# Patient Record
Sex: Female | Born: 1965 | Race: Black or African American | Hispanic: No | Marital: Married | Smoking: Never smoker
Health system: Southern US, Community
[De-identification: ages and names within clinical notes are randomized; demographics above are authoritative.]

## PROBLEM LIST (undated history)

## (undated) DIAGNOSIS — N840 Polyp of corpus uteri: Secondary | ICD-10-CM

## (undated) DIAGNOSIS — K219 Gastro-esophageal reflux disease without esophagitis: Secondary | ICD-10-CM

## (undated) DIAGNOSIS — Z973 Presence of spectacles and contact lenses: Secondary | ICD-10-CM

## (undated) DIAGNOSIS — D250 Submucous leiomyoma of uterus: Secondary | ICD-10-CM

## (undated) HISTORY — PX: NO PAST SURGERIES: SHX2092

---

## 1998-09-24 ENCOUNTER — Ambulatory Visit (HOSPITAL_COMMUNITY): Admission: RE | Admit: 1998-09-24 | Discharge: 1998-09-24 | Payer: Self-pay | Admitting: Physician Assistant

## 1999-11-12 ENCOUNTER — Other Ambulatory Visit: Admission: RE | Admit: 1999-11-12 | Discharge: 1999-11-12 | Payer: Self-pay | Admitting: Obstetrics & Gynecology

## 2000-10-22 ENCOUNTER — Inpatient Hospital Stay (HOSPITAL_COMMUNITY): Admission: AD | Admit: 2000-10-22 | Discharge: 2000-10-22 | Payer: Self-pay | Admitting: Obstetrics and Gynecology

## 2001-02-25 ENCOUNTER — Encounter: Admission: RE | Admit: 2001-02-25 | Discharge: 2001-02-25 | Payer: Self-pay | Admitting: Family Medicine

## 2001-02-25 ENCOUNTER — Encounter: Payer: Self-pay | Admitting: Family Medicine

## 2001-06-24 ENCOUNTER — Other Ambulatory Visit: Admission: RE | Admit: 2001-06-24 | Discharge: 2001-06-24 | Payer: Self-pay | Admitting: Obstetrics and Gynecology

## 2002-08-24 ENCOUNTER — Other Ambulatory Visit: Admission: RE | Admit: 2002-08-24 | Discharge: 2002-08-24 | Payer: Self-pay | Admitting: Obstetrics and Gynecology

## 2004-01-06 ENCOUNTER — Emergency Department (HOSPITAL_COMMUNITY): Admission: EM | Admit: 2004-01-06 | Discharge: 2004-01-06 | Payer: Self-pay

## 2004-09-30 ENCOUNTER — Other Ambulatory Visit: Admission: RE | Admit: 2004-09-30 | Discharge: 2004-09-30 | Payer: Self-pay | Admitting: Obstetrics and Gynecology

## 2005-11-07 ENCOUNTER — Other Ambulatory Visit: Admission: RE | Admit: 2005-11-07 | Discharge: 2005-11-07 | Payer: Self-pay | Admitting: Obstetrics and Gynecology

## 2008-05-05 ENCOUNTER — Encounter: Admission: RE | Admit: 2008-05-05 | Discharge: 2008-05-05 | Payer: Self-pay | Admitting: Family Medicine

## 2009-11-25 IMAGING — US US ABDOMEN COMPLETE
1 series · 14 of 25 positions shown · non-contrast
Comparison: None

CLINICAL DATA: Abdominal pain radiating to the back, nausea and
vomiting

ABDOMEN ULTRASOUND
TECHNIQUE: Complete abdominal ultrasound examination was performed
including evaluation of the liver, gallbladder, bile ducts,
pancreas, kidneys, spleen, IVC, and abdominal aorta.

[Series 1: us abdomen complete · 0.30mm/px · 14 of 55 slices shown]
[im 1/55]
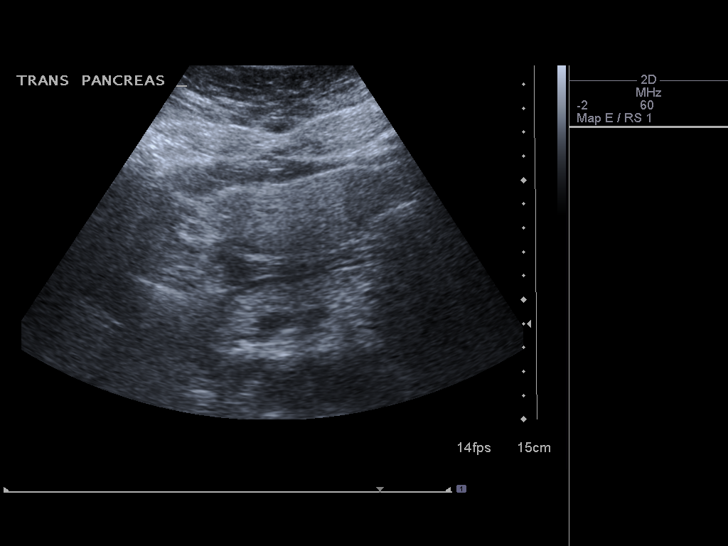
[im 5/55]
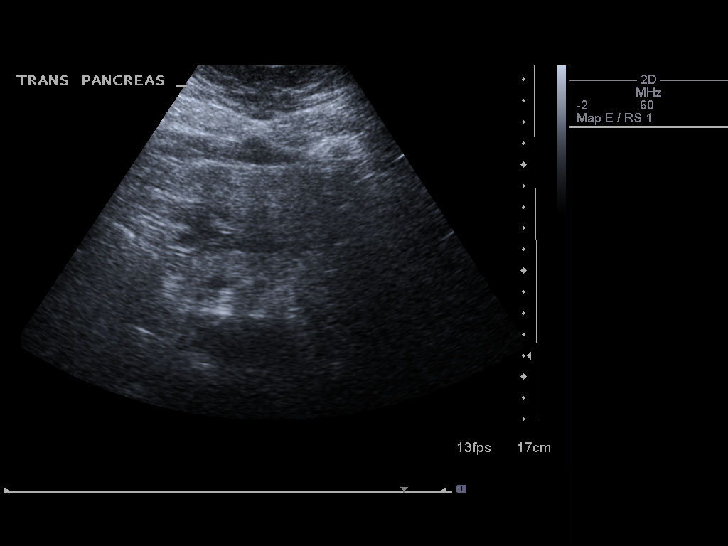
[im 10/55]
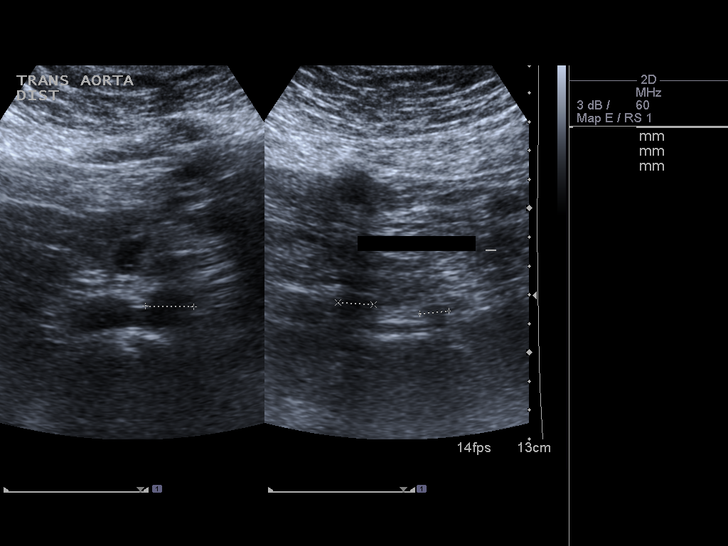
[im 14/55]
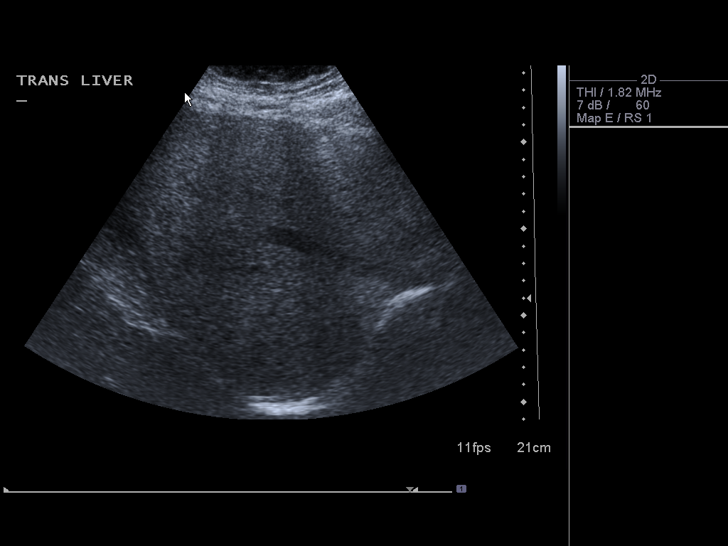
[im 19/55]
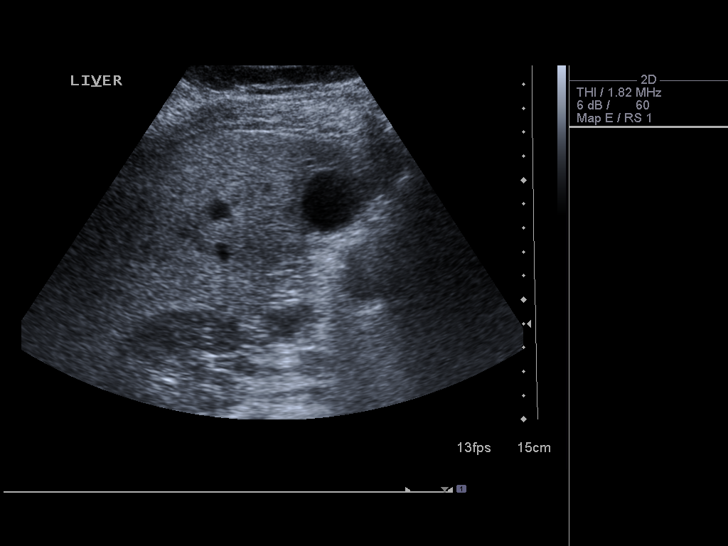
[im 21/55]
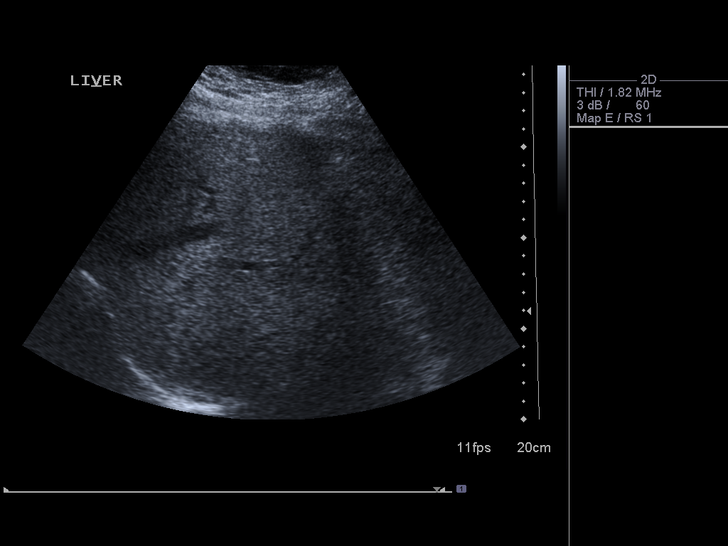
[im 25/55]
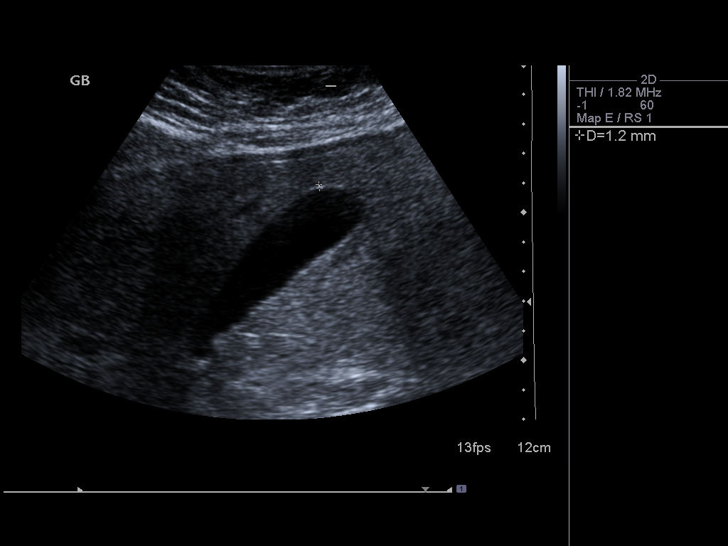
[im 30/55]
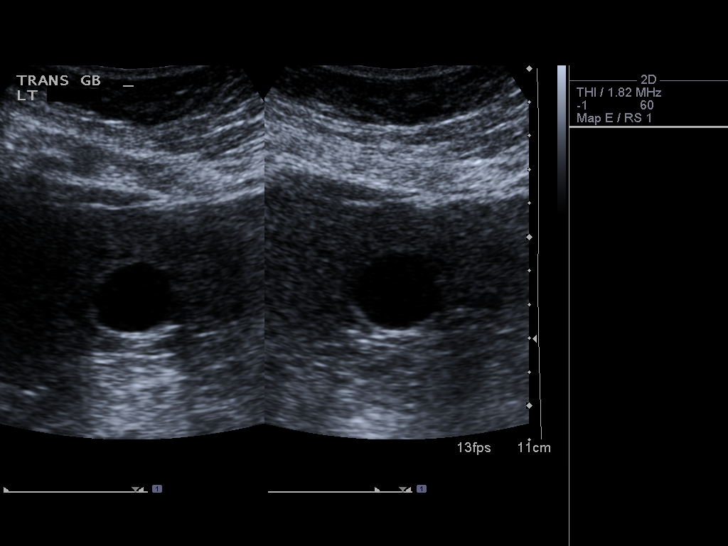
[im 34/55]
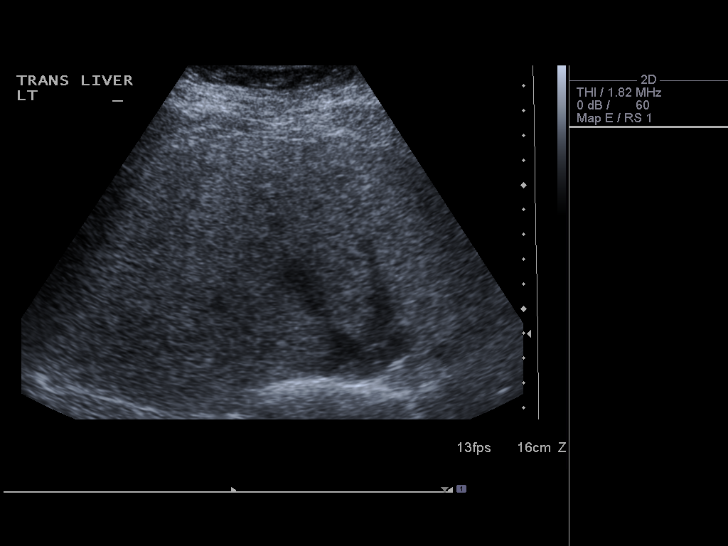
[im 37/55]
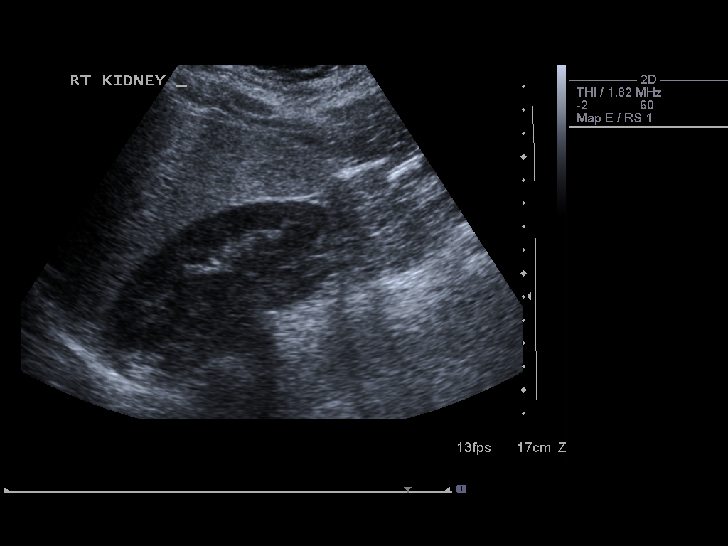
[im 41/55]
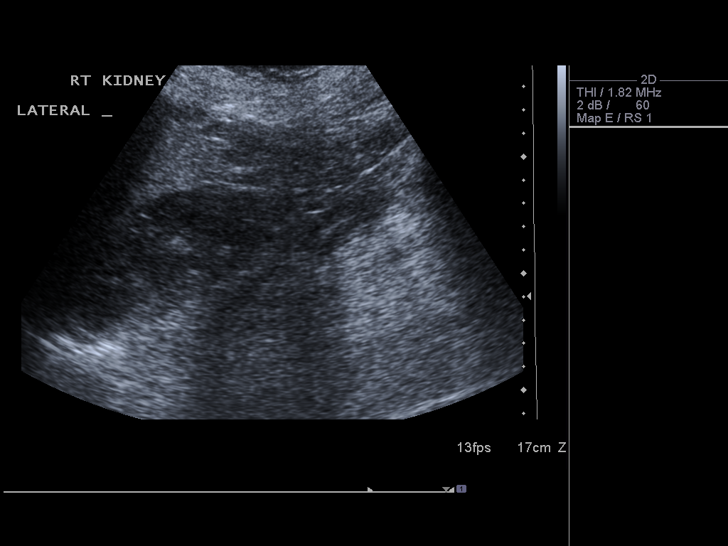
[im 46/55]
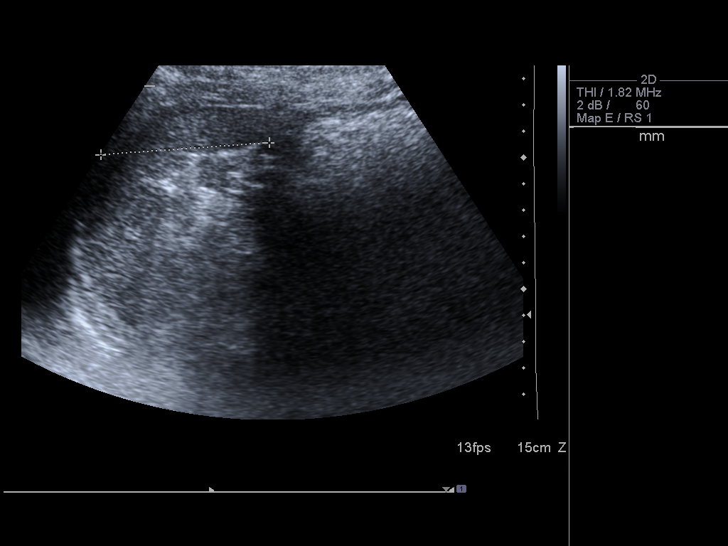
[im 50/55]
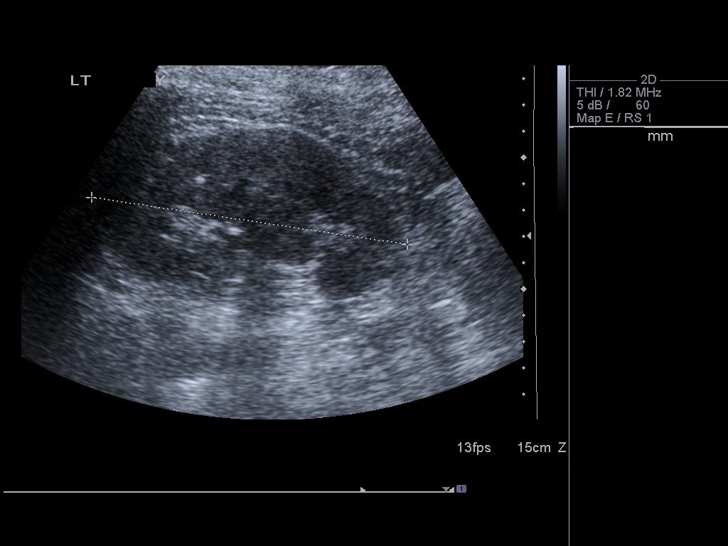
[im 55/55]
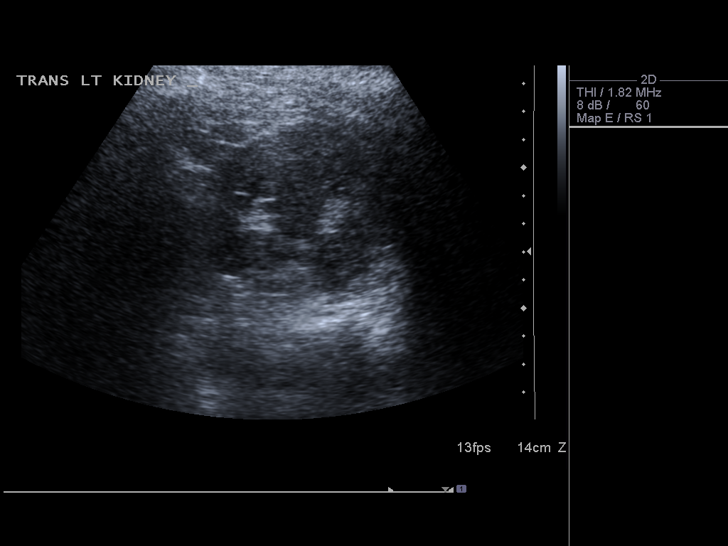

[14 of 25 positions shown; findings below may reference images not displayed]

FINDINGS: The gallbladder is well seen and no gallstones are noted.
The liver is diffusely echogenic consistent with fatty
infiltration.  The common bile duct is normal measuring 2.7 mm in
diameter.  The IVC and spleen appear normal.  The pancreatic head
is obscured by bowel gas as is the tail with the mid body appearing
normal.  No hydronephrosis is seen.  The right kidney measures
cm sagittally, with left kidney measuring 12.1 cm.  The abdominal
aorta is normal in caliber.
IMPRESSION: 1.  No gallstones.  No ductal dilatation.
2.  Diffuse fatty infiltration of the liver.
3.  Head and tail of pancreas obscured by bowel gas.

## 2010-07-14 ENCOUNTER — Encounter: Payer: Self-pay | Admitting: Family Medicine

## 2010-07-14 ENCOUNTER — Encounter: Payer: Self-pay | Admitting: Obstetrics and Gynecology

## 2015-08-20 ENCOUNTER — Other Ambulatory Visit: Payer: Self-pay | Admitting: Physician Assistant

## 2015-08-20 DIAGNOSIS — N6320 Unspecified lump in the left breast, unspecified quadrant: Secondary | ICD-10-CM

## 2015-08-24 ENCOUNTER — Other Ambulatory Visit: Payer: Self-pay

## 2015-08-28 ENCOUNTER — Ambulatory Visit
Admission: RE | Admit: 2015-08-28 | Discharge: 2015-08-28 | Disposition: A | Discharge status: Home / Self Care | Payer: No Typology Code available for payment source | Source: Ambulatory Visit | Attending: Physician Assistant | Admitting: Physician Assistant

## 2015-08-28 ENCOUNTER — Other Ambulatory Visit: Payer: Self-pay | Admitting: Physician Assistant

## 2015-08-28 ENCOUNTER — Ambulatory Visit
Admission: RE | Admit: 2015-08-28 | Discharge: 2015-08-28 | Disposition: A | Discharge status: Home / Self Care | Payer: Self-pay | Source: Ambulatory Visit | Attending: Physician Assistant | Admitting: Physician Assistant

## 2015-08-28 ENCOUNTER — Other Ambulatory Visit: Payer: Self-pay

## 2015-08-28 DIAGNOSIS — N6310 Unspecified lump in the right breast, unspecified quadrant: Secondary | ICD-10-CM

## 2015-08-28 DIAGNOSIS — N6320 Unspecified lump in the left breast, unspecified quadrant: Secondary | ICD-10-CM

## 2016-01-04 ENCOUNTER — Telehealth: Payer: Self-pay

## 2016-01-04 NOTE — Telephone Encounter (Signed)
Error

## 2016-01-04 NOTE — Telephone Encounter (Signed)
pls re send in correct pt record

## 2017-10-21 ENCOUNTER — Encounter (HOSPITAL_COMMUNITY): Payer: No Typology Code available for payment source

## 2017-12-04 ENCOUNTER — Encounter (HOSPITAL_COMMUNITY): Payer: No Typology Code available for payment source

## 2017-12-09 ENCOUNTER — Other Ambulatory Visit: Payer: Self-pay

## 2017-12-09 ENCOUNTER — Encounter (HOSPITAL_BASED_OUTPATIENT_CLINIC_OR_DEPARTMENT_OTHER): Payer: Self-pay | Admitting: *Deleted

## 2017-12-09 NOTE — Progress Notes (Signed)
Spoke w/ pt via phone for pre-op interview.  Npo after mn.  Arrive at 0530.  Needs cbc and urine preg. (pt unable to come prior to [place]).

## 2017-12-15 NOTE — H&P (Signed)
Gabrielle Calderon is an 52 y.o. female with heavy menstrual bleeding to anemia.  Ultrasound showed multiple fibroids the largest of which is intramural and measures 4 cm.  Additionally, on saline infusion, two suspected polyps are noted and an submucosal fibroid measuring 1.9 cm.  The patient strongly desires to keep her uterus but does desire surgical removal of intrauterine findings.    Pertinent Gynecological History: Menses: flow is excessive with use of 7 pads or tampons on heaviest days Bleeding: dysfunctional uterine bleeding Contraception: none DES exposure: unknown Blood transfusions: none Sexually transmitted diseases: no past history Previous GYN Procedures: none  Last mammogram: normal Date: 2018 Last pap: normal Date: 2018 OB History: G1, P0101  Menstrual History: Menarche age: n/a Patient's last menstrual period was 11/28/2017 (approximate).    Past Medical History:  Diagnosis Date  . Endometrial polyp   . GERD (gastroesophageal reflux disease)   . Submucous uterine fibroid   . Wears glasses     Past Surgical History:  Procedure Laterality Date  . NO PAST SURGERIES      History reviewed. No pertinent family history.  Social History:  reports that she has never smoked. She has never used smokeless tobacco. She reports that she drinks alcohol. She reports that she does not use drugs.  Allergies:  Allergies  Allergen Reactions  . Codeine Nausea Only    "EXTREME NAUSEA"    No medications prior to admission.    ROS  Height 5' 7.5" (1.715 m), weight 222 lb (100.7 kg), last menstrual period 11/28/2017. Physical Exam  Constitutional: She is oriented to person, place, and time. She appears well-developed and well-nourished.  GI: Soft. There is no rebound and no guarding.  Neurological: She is alert and oriented to person, place, and time.  Skin: Skin is warm and dry.  Psychiatric: She has a normal mood and affect. Her behavior is normal.    No results found  for this or any previous visit (from the past 24 hour(s)).  No results found.  Assessment/Plan: 52 yo with endometrial polyps and submucosal fibroid -H/S, D&C, myosure -Patient is counseled re: risk of bleeding, infection, scarring, and damage to surrounding structures.  All questions were answered and the patient wishes to proceed.  Lavontae Cornia, [place] 12/15/2017, 7:44 PM

## 2017-12-16 ENCOUNTER — Encounter (HOSPITAL_BASED_OUTPATIENT_CLINIC_OR_DEPARTMENT_OTHER)
Admission: RE | Disposition: A | Discharge status: Home / Self Care | Payer: Self-pay | Source: Ambulatory Visit | Attending: Obstetrics & Gynecology

## 2017-12-16 ENCOUNTER — Ambulatory Visit (HOSPITAL_BASED_OUTPATIENT_CLINIC_OR_DEPARTMENT_OTHER): Payer: BLUE CROSS/BLUE SHIELD | Admitting: Anesthesiology

## 2017-12-16 ENCOUNTER — Ambulatory Visit (HOSPITAL_BASED_OUTPATIENT_CLINIC_OR_DEPARTMENT_OTHER)
Admission: RE | Admit: 2017-12-16 | Discharge: 2017-12-16 | Disposition: A | Discharge status: Home / Self Care | Payer: BLUE CROSS/BLUE SHIELD | Source: Ambulatory Visit | Attending: Obstetrics & Gynecology | Admitting: Obstetrics & Gynecology

## 2017-12-16 ENCOUNTER — Encounter (HOSPITAL_BASED_OUTPATIENT_CLINIC_OR_DEPARTMENT_OTHER): Payer: Self-pay | Admitting: *Deleted

## 2017-12-16 DIAGNOSIS — E669 Obesity, unspecified: Secondary | ICD-10-CM | POA: Insufficient documentation

## 2017-12-16 DIAGNOSIS — Z885 Allergy status to narcotic agent status: Secondary | ICD-10-CM | POA: Insufficient documentation

## 2017-12-16 DIAGNOSIS — N920 Excessive and frequent menstruation with regular cycle: Secondary | ICD-10-CM | POA: Insufficient documentation

## 2017-12-16 DIAGNOSIS — N840 Polyp of corpus uteri: Secondary | ICD-10-CM | POA: Insufficient documentation

## 2017-12-16 DIAGNOSIS — Z6833 Body mass index (BMI) 33.0-33.9, adult: Secondary | ICD-10-CM | POA: Diagnosis not present

## 2017-12-16 DIAGNOSIS — D250 Submucous leiomyoma of uterus: Secondary | ICD-10-CM | POA: Diagnosis not present

## 2017-12-16 HISTORY — DX: Submucous leiomyoma of uterus: D25.0

## 2017-12-16 HISTORY — DX: Polyp of corpus uteri: N84.0

## 2017-12-16 HISTORY — PX: DILATATION & CURETTAGE/HYSTEROSCOPY WITH MYOSURE: SHX6511

## 2017-12-16 HISTORY — DX: Gastro-esophageal reflux disease without esophagitis: K21.9

## 2017-12-16 HISTORY — DX: Presence of spectacles and contact lenses: Z97.3

## 2017-12-16 LAB — POCT PREGNANCY, URINE: Preg Test, Ur: NEGATIVE

## 2017-12-16 LAB — POCT HEMOGLOBIN-HEMACUE: HEMOGLOBIN: 12.6 g/dL (ref 12.0–15.0)

## 2017-12-16 SURGERY — DILATATION & CURETTAGE/HYSTEROSCOPY WITH MYOSURE
Anesthesia: General

## 2017-12-16 MED ORDER — IBUPROFEN 200 MG PO TABS: 800.0000 mg | ORAL_TABLET | Freq: Four times a day (QID) | ORAL | 0 refills | Status: AC | PRN

## 2017-12-16 MED ORDER — SCOPOLAMINE 1 MG/3DAYS TD PT72
MEDICATED_PATCH | TRANSDERMAL | Status: AC
  Filled 2017-12-16: qty 1

## 2017-12-16 MED ORDER — ONDANSETRON HCL 4 MG/2ML IJ SOLN
INTRAMUSCULAR | Status: DC | PRN
  Administered 2017-12-16: 4 mg via INTRAVENOUS

## 2017-12-16 MED ORDER — SODIUM CHLORIDE 0.9 % IR SOLN
Status: DC | PRN
  Administered 2017-12-16 (×2): 3000 mL

## 2017-12-16 MED ORDER — KETOROLAC TROMETHAMINE 30 MG/ML IJ SOLN
INTRAMUSCULAR | Status: DC | PRN
  Administered 2017-12-16: 30 mg via INTRAVENOUS

## 2017-12-16 MED ORDER — LIDOCAINE 2% (20 MG/ML) 5 ML SYRINGE
INTRAMUSCULAR | Status: DC | PRN
  Administered 2017-12-16: 30 mg via INTRAVENOUS

## 2017-12-16 MED ORDER — FENTANYL CITRATE (PF) 100 MCG/2ML IJ SOLN
INTRAMUSCULAR | Status: DC | PRN
  Administered 2017-12-16 (×2): 25 ug via INTRAVENOUS
  Administered 2017-12-16: 50 ug via INTRAVENOUS

## 2017-12-16 MED ORDER — MEPERIDINE HCL 25 MG/ML IJ SOLN
6.2500 mg | INTRAMUSCULAR | Status: DC | PRN
  Filled 2017-12-16: qty 1

## 2017-12-16 MED ORDER — DEXAMETHASONE SODIUM PHOSPHATE 10 MG/ML IJ SOLN
INTRAMUSCULAR | Status: AC
Start: 2017-12-16 — End: ?
  Filled 2017-12-16: qty 1

## 2017-12-16 MED ORDER — LIDOCAINE HCL 1 % IJ SOLN
INTRAMUSCULAR | Status: AC
  Filled 2017-12-16: qty 20

## 2017-12-16 MED ORDER — ONDANSETRON HCL 4 MG/2ML IJ SOLN
INTRAMUSCULAR | Status: AC
  Filled 2017-12-16: qty 2

## 2017-12-16 MED ORDER — MIDAZOLAM HCL 2 MG/2ML IJ SOLN
INTRAMUSCULAR | Status: AC
  Filled 2017-12-16: qty 2

## 2017-12-16 MED ORDER — FENTANYL CITRATE (PF) 100 MCG/2ML IJ SOLN
INTRAMUSCULAR | Status: AC
  Filled 2017-12-16: qty 2

## 2017-12-16 MED ORDER — OXYCODONE-ACETAMINOPHEN 2.5-325 MG PO TABS: 1.0000 | ORAL_TABLET | ORAL | 0 refills | Status: AC | PRN

## 2017-12-16 MED ORDER — PROMETHAZINE HCL 25 MG/ML IJ SOLN
6.2500 mg | INTRAMUSCULAR | Status: DC | PRN
  Filled 2017-12-16: qty 1

## 2017-12-16 MED ORDER — DEXAMETHASONE SODIUM PHOSPHATE 10 MG/ML IJ SOLN
INTRAMUSCULAR | Status: DC | PRN
  Administered 2017-12-16: 10 mg via INTRAVENOUS

## 2017-12-16 MED ORDER — LIDOCAINE 2% (20 MG/ML) 5 ML SYRINGE
INTRAMUSCULAR | Status: AC
Start: 2017-12-16 — End: ?
  Filled 2017-12-16: qty 5

## 2017-12-16 MED ORDER — FENTANYL CITRATE (PF) 100 MCG/2ML IJ SOLN
25.0000 ug | INTRAMUSCULAR | Status: DC | PRN
  Administered 2017-12-16 (×2): 50 ug via INTRAVENOUS
  Filled 2017-12-16: qty 1

## 2017-12-16 MED ORDER — PROPOFOL 10 MG/ML IV BOLUS
INTRAVENOUS | Status: DC | PRN
  Administered 2017-12-16: 120 mg via INTRAVENOUS
  Administered 2017-12-16: 30 mg via INTRAVENOUS

## 2017-12-16 MED ORDER — ARTIFICIAL TEARS OPHTHALMIC OINT
TOPICAL_OINTMENT | OPHTHALMIC | Status: AC
  Filled 2017-12-16: qty 3.5

## 2017-12-16 MED ORDER — SCOPOLAMINE 1 MG/3DAYS TD PT72
MEDICATED_PATCH | TRANSDERMAL | Status: DC | PRN
  Administered 2017-12-16: 1 via TRANSDERMAL

## 2017-12-16 MED ORDER — LACTATED RINGERS IV SOLN
INTRAVENOUS | Status: DC
  Administered 2017-12-16 (×2): via INTRAVENOUS
  Filled 2017-12-16: qty 1000

## 2017-12-16 MED ORDER — LACTATED RINGERS IV SOLN
INTRAVENOUS | Status: DC
  Filled 2017-12-16: qty 1000

## 2017-12-16 MED ORDER — PROPOFOL 10 MG/ML IV BOLUS
INTRAVENOUS | Status: AC
Start: 2017-12-16 — End: ?
  Filled 2017-12-16: qty 40

## 2017-12-16 MED ORDER — MIDAZOLAM HCL 2 MG/2ML IJ SOLN
0.5000 mg | Freq: Once | INTRAMUSCULAR | Status: DC | PRN
  Filled 2017-12-16: qty 2

## 2017-12-16 MED ORDER — MIDAZOLAM HCL 2 MG/2ML IJ SOLN
INTRAMUSCULAR | Status: DC | PRN
  Administered 2017-12-16: 2 mg via INTRAVENOUS

## 2017-12-16 MED ORDER — KETOROLAC TROMETHAMINE 30 MG/ML IJ SOLN
INTRAMUSCULAR | Status: AC
Start: 2017-12-16 — End: ?
  Filled 2017-12-16: qty 1

## 2017-12-16 SURGICAL SUPPLY — 29 items
BIPOLAR CUTTING LOOP 21FR (ELECTRODE)
CANISTER SUCT 3000ML PPV (MISCELLANEOUS) ×3 IMPLANT
CATH ROBINSON RED A/P 16FR (CATHETERS) ×3 IMPLANT
CLOTH BEACON ORANGE TIMEOUT ST (SAFETY) ×3 IMPLANT
COUNTER NEEDLE 1200 MAGNETIC (NEEDLE) ×3 IMPLANT
DEVICE MYOSURE LITE (MISCELLANEOUS) IMPLANT
DEVICE MYOSURE REACH (MISCELLANEOUS) IMPLANT
DILATOR CANAL MILEX (MISCELLANEOUS) IMPLANT
ELECT REM PT RETURN 9FT ADLT (ELECTROSURGICAL)
ELECTRODE REM PT RTRN 9FT ADLT (ELECTROSURGICAL) IMPLANT
FILTER ARTHROSCOPY CONVERTOR (FILTER) ×3 IMPLANT
GLOVE BIO SURGEON STRL SZ 6 (GLOVE) ×6 IMPLANT
GLOVE BIOGEL PI IND STRL 6 (GLOVE) ×1 IMPLANT
GLOVE BIOGEL PI INDICATOR 6 (GLOVE) ×2
GLOVE SURG SS PI 6.0 STRL IVOR (GLOVE) ×6 IMPLANT
GOWN STRL REUS W/ TWL LRG LVL3 (GOWN DISPOSABLE) ×2 IMPLANT
GOWN STRL REUS W/TWL LRG LVL3 (GOWN DISPOSABLE) ×4
IV NS IRRIG 3000ML ARTHROMATIC (IV SOLUTION) ×3 IMPLANT
KIT TURNOVER CYSTO (KITS) ×3 IMPLANT
LOOP CUTTING BIPOLAR 21FR (ELECTRODE) IMPLANT
MYOSURE XL FIBROID REM (MISCELLANEOUS) ×3
PACK VAGINAL MINOR WOMEN LF (CUSTOM PROCEDURE TRAY) ×3 IMPLANT
PAD OB MATERNITY 4.3X12.25 (PERSONAL CARE ITEMS) ×3 IMPLANT
SEAL ROD LENS SCOPE MYOSURE (ABLATOR) ×3 IMPLANT
SYSTEM TISS REMOVAL MYSR XL RM (MISCELLANEOUS) ×1 IMPLANT
TOWEL OR 17X24 6PK STRL BLUE (TOWEL DISPOSABLE) ×6 IMPLANT
TUBING AQUILEX INFLOW (TUBING) ×3 IMPLANT
TUBING AQUILEX OUTFLOW (TUBING) ×3 IMPLANT
WATER STERILE IRR 500ML POUR (IV SOLUTION) ×3 IMPLANT

## 2017-12-16 NOTE — Discharge Instructions (Signed)
Call MD for T>100.4, heavy vaginal bleeding, severe abdominal pain, intractable nausea and/or vomiting, or respiratory distress.  Call office to schedule postop appointment in 2 weeks.  No driving while taking narcotics.  Pelvic rest x 4 weeks.   DISCHARGE INSTRUCTIONS: D&C / D&E The following instructions have been prepared to help you care for yourself upon your return home.   Personal hygiene:  Use sanitary pads for vaginal drainage, not tampons.  Shower the day after your procedure.  NO tub baths, pools or Jacuzzis for 2-3 weeks.  Wipe front to back after using the bathroom.  Activity and limitations:  Do NOT drive or operate any equipment for 24 hours. The effects of anesthesia are still present and drowsiness may result.  Do NOT rest in bed all day.  Walking is encouraged.  Walk up and down stairs slowly.  You may resume your normal activity in one to two days or as indicated by your physician.  Sexual activity: NO intercourse for at least 2 weeks after the procedure, or as indicated by your physician.  Diet: Eat a light meal as desired this evening. You may resume your usual diet tomorrow.  Return to work: You may resume your work activities in one to two days or as indicated by your doctor.  What to expect after your surgery: Expect to have vaginal bleeding/discharge for 2-3 days and spotting for up to 10 days. It is not unusual to have soreness for up to 1-2 weeks. You may have a slight burning sensation when you urinate for the first day. Mild cramps may continue for a couple of days. You may have a regular period in 2-6 weeks.  Call your doctor for any of the following:  Excessive vaginal bleeding, saturating and changing one pad every hour.  Inability to urinate 6 hours after discharge from hospital.  Pain not relieved by pain medication.  Fever of 100.4 F or greater.  Unusual vaginal discharge or odor.  Post Anesthesia Home Care  Instructions  Activity: Get plenty of rest for the remainder of the day. A responsible individual must stay with you for 24 hours following the procedure.  For the next 24 hours, DO NOT: -Drive a car -Paediatric nurse -Drink alcoholic beverages -Take any medication unless instructed by your physician -Make any legal decisions or sign important papers.  Meals: Start with liquid foods such as gelatin or soup. Progress to regular foods as tolerated. Avoid greasy, spicy, heavy foods. If nausea and/or vomiting occur, drink only clear liquids until the nausea and/or vomiting subsides. Call your physician if vomiting continues.  Special Instructions/Symptoms: Your throat may feel dry or sore from the anesthesia or the breathing tube placed in your throat during surgery. If this causes discomfort, gargle with warm salt water. The discomfort should disappear within 24 hours.  If you had a scopolamine patch placed behind your ear for the management of post- operative nausea and/or vomiting:  1. The medication in the patch is effective for 72 hours, after which it should be removed.  Wrap patch in a tissue and discard in the trash. Wash hands thoroughly with soap and water. 2. You may remove the patch earlier than 72 hours if you experience unpleasant side effects which may include dry mouth, dizziness or visual disturbances. 3. Avoid touching the patch. Wash your hands with soap and water after contact with the patch.

## 2017-12-16 NOTE — Op Note (Signed)
PREOPERATIVE DIAGNOSIS:  52 y.o. with heavy menstrual bleeding, endometrial polyp and submucosal fibroid  POSTOPERATIVE DIAGNOSIS: The same  PROCEDURE: Hysteroscopy, Dilation and Curettage, Myosure  SURGEON:  Dr. Linda Hedges  INDICATIONS: 52 y.o. here for scheduled surgery for H/S, D&C, myosure.   Risks of surgery were discussed with the patient including but not limited to: bleeding which may require transfusion; infection which may require antibiotics; injury to uterus or surrounding organs; intrauterine scarring which may impair future fertility; need for additional procedures including laparotomy or laparoscopy; and other postoperative/anesthesia complications. Written informed consent was obtained.    FINDINGS:  A 12 week size uterus.  Endometrial polyps and submucosal fibroid (left).  ANESTHESIA:   General  ESTIMATED BLOOD LOSS:  50cc  SPECIMENS: Myosure specimen and endometrial curettings sent to pathology  COMPLICATIONS:  None immediate.  PROCEDURE DETAILS:  The patient was taken to the operating room where general anesthesia was administered and was found to be adequate.  After an adequate timeout was performed, she was placed in the dorsal lithotomy position and examined; then prepped and draped in the sterile manner.   Her bladder was catheterized for an unmeasured amount of clear, yellow urine. A speculum was then placed in the patient's vagina and a single tooth tenaculum was applied to the anterior lip of the cervix. The uteus was sounded to 13 cm and dilated manually with metal dilators to accommodate the Myosure operative hysteroscope.  Once the cervix was dilated, the hysteroscope was inserted under direct visualization using saline as a suspension medium.  The uterine cavity was carefully examined, both ostia were recognized as well as the above listed findings.  The Myosure was advanced into the uterus and intrauterine pathology was removed.  The left submucosal fibroid was  removed until flush with myometrium.  After further careful visualization of the uterine cavity, the hysteroscope was removed under direct visualization.  A sharp curettage was then performed to obtain a minimal amount of endometrial curettings.  The tenaculum was removed from the anterior lip of the cervix and the vaginal speculum was removed after noting good hemostasis.  The patient tolerated the procedure well and was taken to the recovery area awake, extubated and in stable condition.

## 2017-12-16 NOTE — Progress Notes (Signed)
No change to H&P.  Gean Laursen, DO 

## 2017-12-16 NOTE — Anesthesia Procedure Notes (Signed)
Procedure Name: LMA Insertion Date/Time: 12/16/2017 7:56 AM Performed by: Wanita Chamberlain, CRNA Pre-anesthesia Checklist: Patient identified, Timeout performed, Emergency Drugs available, Suction available and Patient being monitored Patient Re-evaluated:Patient Re-evaluated prior to induction Oxygen Delivery Method: Circle system utilized Preoxygenation: Pre-oxygenation with 100% oxygen Induction Type: IV induction Ventilation: Mask ventilation without difficulty LMA: LMA inserted LMA Size: 4.0 Number of attempts: 1 Placement Confirmation: positive ETCO2,  CO2 detector and breath sounds checked- equal and bilateral Tube secured with: Tape Dental Injury: Teeth and Oropharynx as per pre-operative assessment

## 2017-12-16 NOTE — Transfer of Care (Signed)
Immediate Anesthesia Transfer of Care Note  Patient: Gabrielle Calderon  Procedure(s) Performed: DILATATION & CURETTAGE/HYSTEROSCOPY WITH MYOSURE (N/A )  Patient Location: PACU  Anesthesia Type:General  Level of Consciousness: awake, alert , oriented and patient cooperative  Airway & Oxygen Therapy: Patient Spontanous Breathing and Patient connected to nasal cannula oxygen  Post-op Assessment: Report given to RN and Post -op Vital signs reviewed and stable  Post vital signs: Reviewed and stable  Last Vitals:  Vitals Value Taken Time  BP    Temp    Pulse    Resp    SpO2      Last Pain:  Vitals:   12/16/17 0547  TempSrc: Oral         Complications: No apparent anesthesia complications

## 2017-12-16 NOTE — Anesthesia Postprocedure Evaluation (Signed)
Anesthesia Post Note  Patient: Gabrielle Calderon  Procedure(s) Performed: DILATATION & CURETTAGE/HYSTEROSCOPY WITH MYOSURE (N/A )     Patient location during evaluation: PACU Anesthesia Type: General Level of consciousness: awake and alert Pain management: pain level controlled Vital Signs Assessment: post-procedure vital signs reviewed and stable Respiratory status: spontaneous breathing, nonlabored ventilation and respiratory function stable Cardiovascular status: blood pressure returned to baseline and stable Postop Assessment: no apparent nausea or vomiting Anesthetic complications: no    Last Vitals:  Vitals:   12/16/17 1005 12/16/17 1035  BP:  129/74  Pulse: 63 60  Resp: 15 16  Temp:  36.6 C  SpO2: 99% 100%    Last Pain:  Vitals:   12/16/17 1035  TempSrc: Oral  PainSc: 0-No pain                 Gabrielle Calderon,E. Shayle Donahoo

## 2017-12-16 NOTE — Anesthesia Preprocedure Evaluation (Addendum)
Anesthesia Evaluation  Patient identified by MRN, date of birth, ID band Patient awake    Reviewed: Allergy & Precautions, NPO status , Patient's Chart, lab work & pertinent test results  History of Anesthesia Complications Negative for: history of anesthetic complications  Airway Mallampati: I  TM Distance: >3 FB Neck ROM: Full    Dental  (+) Dental Advisory Given, Teeth Intact   Pulmonary neg pulmonary ROS,    breath sounds clear to auscultation       Cardiovascular (-) anginanegative cardio ROS   Rhythm:Regular Rate:Normal     Neuro/Psych negative neurological ROS     GI/Hepatic negative GI ROS, Neg liver ROS, neg GERD  ,  Endo/Other  obese  Renal/GU negative Renal ROS     Musculoskeletal negative musculoskeletal ROS (+)   Abdominal (+) + obese,   Peds  Hematology negative hematology ROS (+)   Anesthesia Other Findings   Reproductive/Obstetrics negative OB ROS                           Anesthesia Physical Anesthesia Plan  ASA: II  Anesthesia Plan: General   Post-op Pain Management:    Induction: Intravenous  PONV Risk Score and Plan: 3 and Ondansetron, Dexamethasone and Scopolamine patch - Pre-op  Airway Management Planned: LMA  Additional Equipment:   Intra-op Plan:   Post-operative Plan:   Informed Consent: I have reviewed the patients History and Physical, chart, labs and discussed the procedure including the risks, benefits and alternatives for the proposed anesthesia with the patient or authorized representative who has indicated his/her understanding and acceptance.   Dental advisory given  Plan Discussed with: CRNA, Surgeon and Anesthesiologist  Anesthesia Plan Comments: (Plan routine monitors, GA- LMA OK)       Anesthesia Quick Evaluation

## 2017-12-17 ENCOUNTER — Encounter (HOSPITAL_BASED_OUTPATIENT_CLINIC_OR_DEPARTMENT_OTHER): Payer: Self-pay | Admitting: Obstetrics & Gynecology

## 2023-12-01 ENCOUNTER — Other Ambulatory Visit: Payer: Self-pay | Admitting: Family Medicine

## 2023-12-01 DIAGNOSIS — Z1231 Encounter for screening mammogram for malignant neoplasm of breast: Secondary | ICD-10-CM
